# Patient Record
Sex: Male | Born: 2000 | Race: Black or African American | Hispanic: No | Marital: Single
Health system: Southern US, Community
[De-identification: ages and names within clinical notes are randomized; demographics above are authoritative.]

---
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
## 2023-07-15 ENCOUNTER — Other Ambulatory Visit: Payer: Self-pay

## 2023-07-15 ENCOUNTER — Emergency Department (HOSPITAL_COMMUNITY)
Admission: EM | Admit: 2023-07-15 | Discharge: 2023-07-15 | Discharge status: Against Medical Advice | Payer: Medicaid Other | Attending: Emergency Medicine | Admitting: Emergency Medicine

## 2023-07-15 DIAGNOSIS — R52 Pain, unspecified: Secondary | ICD-10-CM | POA: Insufficient documentation

## 2023-07-15 DIAGNOSIS — H6122 Impacted cerumen, left ear: Secondary | ICD-10-CM | POA: Diagnosis not present

## 2023-07-15 DIAGNOSIS — Z5329 Procedure and treatment not carried out because of patient's decision for other reasons: Secondary | ICD-10-CM | POA: Diagnosis not present

## 2023-07-15 MED ORDER — DOCUSATE SODIUM 50 MG/5ML PO LIQD
100.0000 mg | Freq: Once | ORAL | Status: DC
  Filled 2023-07-15: qty 10

## 2023-07-15 NOTE — ED Triage Notes (Signed)
 Pt arrived via POV. C/o gen body pain for 5x months. No other symptoms.  AOx4

## 2023-07-15 NOTE — ED Provider Notes (Signed)
 [facility] EMERGENCY DE[facility]MENT AT [facility] Provider Note   CSN: [id number] Arrival date & time: 07/15/23  1429     History  Chief Complaint  Patient presents with   Generalized Body Aches    Nicolas Rodriguez is a 23 y.o. male presents today for ringing in his ears and generalized bodyaches for approximately 5 months.  Patient denies fever, weakness, abdominal pain, headache, cough, congestion, sore throat, N/V/D, chest pain, or shortness of breath.  Patient states he did take Motrin 1 time for the pain.  HPI     Home Medications Prior to Admission medications   Not on File      Allergies    Patient has no allergy information on record.    Review of Systems   Review of Systems  HENT:  Positive for tinnitus.   Eyes:  Positive for pain.  Musculoskeletal:  Positive for myalgias.    Physical Exam Updated Vital Signs BP (!) 156/76 (BP Location: Left Arm)   Pulse 77   Temp 98.6 F (37 C) (Oral)   Resp 17   Ht 6' 3 (1.905 m)   Wt 81.6 kg   SpO2 100%   BMI 22.50 kg/m  Physical Exam Vitals and nursing note reviewed.  Constitutional:      General: He is not in acute distress.    Appearance: Normal appearance. He is well-developed and normal weight. He is not ill-appearing.  HENT:     Head: Normocephalic and atraumatic.     Right Ear: Tympanic membrane and external ear normal.     Left Ear: External ear normal. There is impacted cerumen.     Nose: Nose normal.     Mouth/Throat:     Mouth: Mucous membranes are moist.  Eyes:     Extraocular Movements: Extraocular movements intact.     Conjunctiva/sclera: Conjunctivae normal.  Cardiovascular:     Rate and Rhythm: Normal rate and regular rhythm.     Pulses: Normal pulses.     Heart sounds: Normal heart sounds. No murmur heard. Pulmonary:     Effort: Pulmonary effort is normal. No respiratory distress.     Breath sounds: Normal breath sounds.  Abdominal:     Palpations: Abdomen is soft.   Musculoskeletal:        General: No swelling or tenderness. Normal range of motion.     Cervical back: Normal range of motion and neck supple. No tenderness.  Skin:    General: Skin is warm and dry.     Capillary Refill: Capillary refill takes less than 2 seconds.  Neurological:     General: No focal deficit present.     Mental Status: He is alert.     Motor: No weakness.  Psychiatric:        Mood and Affect: Mood normal.     ED Results / Procedures / Treatments   Labs (all labs ordered are listed, but only abnormal results are displayed) Labs Reviewed  BASIC METABOLIC PANEL  CBC WITH DIFFERENTIAL/PLATELET  CK    EKG None  Radiology No results found.  Procedures Procedures    Medications Ordered in ED Medications - No data to display  ED Course/ Medical Decision Making/ A&P                                 Medical Decision Making Amount and/or Complexity of Data Reviewed Labs: ordered.  Risk OTC  drugs.   This patient presents to the ED with chief complaint(s) of generalized bodyaches and ringing in ears with pertinent past medical history of none which further complicates the presenting complaint. The complaint involves an extensive differential diagnosis and also carries with it a high risk of complications and morbidity.    The differential diagnosis includes cerumen impaction, musculoskeletal pain, rhabdomyolysis  Additional history obtained: Additional history obtained from family  ED Course and Reassessment:   Independent labs interpretation:  The following labs were independently interpreted:  CBC: BMP:  CK:  Patient and father walked out prior to labs and before provider could discuss risks of leaving AMA.        Final Clinical Impression(s) / ED Diagnoses Final diagnoses:  None    Rx / DC Orders ED Discharge Orders     None         Francis Ileana SAILOR, PA-C 07/15/23 1801    Ruthe Cornet, DO 07/22/23 2015

## 2023-07-29 ENCOUNTER — Emergency Department (HOSPITAL_COMMUNITY)
Admission: EM | Admit: 2023-07-29 | Discharge: 2023-07-30 | Disposition: A | Discharge status: Home / Self Care | Payer: Medicaid Other | Attending: Emergency Medicine | Admitting: Emergency Medicine

## 2023-07-29 ENCOUNTER — Encounter (HOSPITAL_COMMUNITY): Payer: Self-pay

## 2023-07-29 DIAGNOSIS — R109 Unspecified abdominal pain: Secondary | ICD-10-CM | POA: Diagnosis not present

## 2023-07-29 DIAGNOSIS — M791 Myalgia, unspecified site: Secondary | ICD-10-CM

## 2023-07-29 DIAGNOSIS — R079 Chest pain, unspecified: Secondary | ICD-10-CM | POA: Insufficient documentation

## 2023-07-29 DIAGNOSIS — N489 Disorder of penis, unspecified: Secondary | ICD-10-CM | POA: Diagnosis not present

## 2023-07-29 DIAGNOSIS — M7918 Myalgia, other site: Secondary | ICD-10-CM | POA: Diagnosis present

## 2023-07-29 DIAGNOSIS — R519 Headache, unspecified: Secondary | ICD-10-CM | POA: Insufficient documentation

## 2023-07-29 DIAGNOSIS — M549 Dorsalgia, unspecified: Secondary | ICD-10-CM | POA: Insufficient documentation

## 2023-07-29 NOTE — ED Triage Notes (Addendum)
Pt is coming in for General body discomfort that is felt throughout body. He states he feels general discomfort " in his legs, hands, butt, butt hole, penis, penis hole, arms, and head ". He has no other complaints of shortness of breath, no chest pain, no fevers. Family member accompanying him states sometimes he moves his arms erratically.

## 2023-07-30 ENCOUNTER — Telehealth: Payer: Self-pay

## 2023-07-30 LAB — URINALYSIS, ROUTINE W REFLEX MICROSCOPIC
Bacteria, UA: NONE SEEN
Bilirubin Urine: NEGATIVE
Glucose, UA: NEGATIVE mg/dL
Ketones, ur: NEGATIVE mg/dL
Leukocytes,Ua: NEGATIVE
Nitrite: NEGATIVE
Protein, ur: NEGATIVE mg/dL
Specific Gravity, Urine: 1.019 (ref 1.005–1.030)
pH: 5 (ref 5.0–8.0)

## 2023-07-30 LAB — COMPREHENSIVE METABOLIC PANEL
ALT: 16 U/L (ref 0–44)
AST: 22 U/L (ref 15–41)
Albumin: 4.3 g/dL (ref 3.5–5.0)
Alkaline Phosphatase: 59 U/L (ref 38–126)
Anion gap: 7 (ref 5–15)
BUN: 11 mg/dL (ref 6–20)
CO2: 25 mmol/L (ref 22–32)
Calcium: 9.3 mg/dL (ref 8.9–10.3)
Chloride: 103 mmol/L (ref 98–111)
Creatinine, Ser: 0.92 mg/dL (ref 0.61–1.24)
GFR, Estimated: >60 mL/min (ref >60)
Glucose, Bld: 110 mg/dL — ABNORMAL HIGH (ref 70–99)
Potassium: 3.7 mmol/L (ref 3.5–5.1)
Sodium: 135 mmol/L (ref 135–145)
Total Bilirubin: 0.9 mg/dL (ref 0.0–1.2)
Total Protein: 7.1 g/dL (ref 6.5–8.1)

## 2023-07-30 LAB — CBC WITH DIFFERENTIAL/PLATELET
Abs Immature Granulocytes: 0.01 10*3/uL (ref 0.00–0.07)
Basophils Absolute: 0 10*3/uL (ref 0.0–0.1)
Basophils Relative: 1 %
Eosinophils Absolute: 0.1 10*3/uL (ref 0.0–0.5)
Eosinophils Relative: 2 %
HCT: 43.9 % (ref 39.0–52.0)
Hemoglobin: 15 g/dL (ref 13.0–17.0)
Immature Granulocytes: 0 %
Lymphocytes Relative: 43 %
Lymphs Abs: 1.7 10*3/uL (ref 0.7–4.0)
MCH: 29.5 pg (ref 26.0–34.0)
MCHC: 34.2 g/dL (ref 30.0–36.0)
MCV: 86.4 fL (ref 80.0–100.0)
Monocytes Absolute: 0.4 10*3/uL (ref 0.1–1.0)
Monocytes Relative: 9 %
Neutro Abs: 1.8 10*3/uL (ref 1.7–7.7)
Neutrophils Relative %: 45 %
Platelets: 46 10*3/uL — ABNORMAL LOW (ref 150–400)
RBC: 5.08 MIL/uL (ref 4.22–5.81)
RDW: 11 % — ABNORMAL LOW (ref 11.5–15.5)
WBC: 4 10*3/uL (ref 4.0–10.5)
nRBC: 0 % (ref 0.0–0.2)

## 2023-07-30 LAB — RAPID URINE DRUG SCREEN, HOSP PERFORMED
Amphetamines: NOT DETECTED
Barbiturates: NOT DETECTED
Benzodiazepines: NOT DETECTED
Cocaine: NOT DETECTED
Opiates: NOT DETECTED
Tetrahydrocannabinol: NOT DETECTED

## 2023-07-30 LAB — CK: Total CK: 235 U/L (ref 49–397)

## 2023-07-30 NOTE — ED Provider Notes (Signed)
[place] EMERGENCY DEPARTMENT AT [facility] Provider Note   CSN: [id number] Arrival date & time: 07/29/23  2205     History  Chief Complaint  Patient presents with   General Discomfort     Nicolas Rodriguez is a 23 y.o. male.  The history is provided by the patient.      Patient without any known medical conditions presents for multiple complaints.  Patient reports he has body discomfort throughout his entire body.  He reports has been ongoing for months.  He reports pain in his head, uvula, chest abdomen and penis.  He also reports extremity pain and back pain.  He does not take any daily medications. No fevers or vomiting. Patient recently seen in the ER for this, but left before full evaluation could be complete Home Medications Prior to Admission medications   Not on File      Allergies    Patient has no known allergies.    Review of Systems   Review of Systems  Constitutional:  Negative for fever.  Musculoskeletal:  Positive for myalgias.    Physical Exam Updated Vital Signs BP 130/89   Pulse 64   Temp 98.8 F (37.1 C)   Resp 16   SpO2 100%  Physical Exam CONSTITUTIONAL: Well developed/well nourished, no distress HEAD: Normocephalic/atraumatic EYES: EOMI/PERRL ENMT: Mucous membranes moist, uvula midline, no erythema or exudates or edema.  No stridor or drooling NECK: supple no meningeal signs SPINE/BACK:entire spine nontender CV: S1/S2 noted, no murmurs/rubs/gallops noted LUNGS: Lungs are clear to auscultation bilaterally, no apparent distress ABDOMEN: soft, nontender, no rebound or guarding, bowel sounds noted throughout abdomen GU:no cva tenderness GU exam performed with nurse Thompson Grayer present.  Testicles descended bilaterally without any signs of hernia, no signs of swelling or tenderness.  No penile lesions or discharge is noted NEURO: Pt is awake/alert/appropriate, moves all extremitiesx4.  No facial droop.  No arm or leg drift EXTREMITIES:  pulses normal/equalx4, full ROM, no deformities or edema SKIN: warm, color normal PSYCH: Mildly anxious  ED Results / Procedures / Treatments   Labs (all labs ordered are listed, but only abnormal results are displayed) Labs Reviewed  CBC WITH DIFFERENTIAL/PLATELET - Abnormal; Notable for the following components:      Result Value   RDW 11.0 (*)    Platelets 46 (*)    All other components within normal limits  COMPREHENSIVE METABOLIC PANEL - Abnormal; Notable for the following components:   Glucose, Bld 110 (*)    All other components within normal limits  URINALYSIS, ROUTINE W REFLEX MICROSCOPIC - Abnormal; Notable for the following components:   Hgb urine dipstick SMALL (*)    All other components within normal limits  CK  RAPID URINE DRUG SCREEN, HOSP PERFORMED    EKG EKG Interpretation Date/Time:  Tuesday July 30 2023 01:16:46 EST Ventricular Rate:  66 PR Interval:  146 QRS Duration:  99 QT Interval:  379 QTC Calculation: 397 R Axis:   90  Text Interpretation: Sinus rhythm Borderline right axis deviation ST elev, probable normal early repol pattern No previous ECGs available Confirmed by Zadie Rhine ([id number]) on 07/30/2023 1:23:12 AM  Radiology No results found.  Procedures Procedures    Medications Ordered in ED Medications - No data to display  ED Course/ Medical Decision Making/ A&P Clinical Course as of 07/30/23 0253  Tue Jul 30, 2023  0253 Overall labs are reassuring.  Patient is in no acute distress.  His exam is overall unremarkable.  Patient is had the symptoms for months. No acute findings at this time.  Discussed findings with patient and his father.  Patient safe for discharge, will place [facility] consult and help arrange outpatient follow-up [DW]    Clinical Course User Index [DW] Zadie Rhine, MD                                 Medical Decision Making Amount and/or Complexity of Data Reviewed Labs: ordered. ECG/medicine tests:  ordered.           Final Clinical Impression(s) / ED Diagnoses Final diagnoses:  Myalgia    Rx / DC Orders ED Discharge Orders     None         Zadie Rhine, MD 07/30/23 [phone]

## 2023-07-30 NOTE — Telephone Encounter (Signed)
Per chart review patient dc this am. Received TOC consult for PCP needs due to multiple ED visits and no PCP on file. This RNCM spoke with patient who reports his mother has scheduled his PCP appointment on 08/13/23. Patient unsure which PCP office the appointment is scheduled for.  No additional TOC needs at this time
